# Patient Record
Sex: Male | Born: 1995 | Race: Black or African American | Hispanic: No | Marital: Single | Smoking: Never smoker
Health system: Southern US, Community
[De-identification: ages and names within clinical notes are randomized; demographics above are authoritative.]

## PROBLEM LIST (undated history)

## (undated) DIAGNOSIS — S92919A Unspecified fracture of unspecified toe(s), initial encounter for closed fracture: Secondary | ICD-10-CM

## (undated) HISTORY — DX: Unspecified fracture of unspecified toe(s), initial encounter for closed fracture: S92.919A

---
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
## 2017-06-11 NOTE — Progress Notes (Deleted)
   [facility]           Phone: [phone]  Date of Visit: 06/12/2017   HPI:  Patient presents today for a well adult male exam.   Concerns today: *** Sexual activity: *** STD Screening: *** Exercise: *** Diet: *** Smoking: *** Alcohol: *** Drugs: *** Advance directives: *** Mood: ***  ROS: See HPI  PMFSH:  PMH: Atopic Dermatitis History of Chlamydia  Cancers in family: ***  PHYSICAL EXAM: There were no vitals taken for this visit. Gen: NAD, pleasant, cooperative HEENT: NCAT, PERRL, no palpable thyromegaly or anterior cervical lymphadenopathy Heart: RRR, no murmurs Lungs: CTAB, NWOB Abdomen: soft, nontender to palpation Neuro: grossly nonfocal, speech normal GU: ***  ASSESSMENT/PLAN:  # Health maintenance:  -STD screening: *** -immunizations -lipid screening: *** -colonoscopy: *** -advance directives: *** -handout given on health maintenance topics  FOLLOW UP: Follow up in *** for ***  Palma HolterKanishka G Othal Kubitz, MD PGY 2 [facility]

## 2017-06-12 ENCOUNTER — Encounter: Payer: Medicaid Other | Admitting: Internal Medicine

## 2018-03-14 DEATH — deceased
